# Patient Record
Sex: Male | Born: 1985 | Race: Black or African American | Hispanic: No | Marital: Single | Smoking: Current every day smoker
Health system: Southern US, Community
[De-identification: ages and names within clinical notes are randomized; demographics above are authoritative.]

---
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
## 2008-12-03 ENCOUNTER — Emergency Department (HOSPITAL_COMMUNITY): Admission: EM | Admit: 2008-12-03 | Discharge: 2008-12-03 | Payer: Self-pay | Admitting: Emergency Medicine

## 2009-08-03 ENCOUNTER — Emergency Department (HOSPITAL_COMMUNITY): Admission: EM | Admit: 2009-08-03 | Discharge: 2009-08-03 | Payer: Self-pay | Admitting: Family Medicine

## 2010-11-15 LAB — DIFFERENTIAL
Basophils Relative: 0 % (ref 0–1)
Eosinophils Relative: 0 % (ref 0–5)
Lymphocytes Relative: 24 % (ref 12–46)
Lymphs Abs: 2.1 10*3/uL (ref 0.7–4.0)
Monocytes Absolute: 0.9 10*3/uL (ref 0.1–1.0)
Monocytes Relative: 10 % (ref 3–12)
Neutro Abs: 5.6 10*3/uL (ref 1.7–7.7)

## 2010-11-15 LAB — CBC
HCT: 42.6 % (ref 39.0–52.0)
Hemoglobin: 14 g/dL (ref 13.0–17.0)
MCHC: 32.9 g/dL (ref 30.0–36.0)
Platelets: 197 10*3/uL (ref 150–400)

## 2010-11-15 LAB — POCT URINALYSIS DIP (DEVICE)
Nitrite: NEGATIVE
Protein, ur: 30 mg/dL — AB
Urobilinogen, UA: 1 mg/dL (ref 0.0–1.0)
pH: 7 (ref 5.0–8.0)

## 2012-04-17 ENCOUNTER — Encounter (HOSPITAL_COMMUNITY): Payer: Self-pay | Admitting: *Deleted

## 2012-04-17 ENCOUNTER — Emergency Department (HOSPITAL_COMMUNITY)
Admission: EM | Admit: 2012-04-17 | Discharge: 2012-04-17 | Disposition: A | Discharge status: Home / Self Care | Payer: Self-pay | Attending: Emergency Medicine | Admitting: Emergency Medicine

## 2012-04-17 DIAGNOSIS — T63461A Toxic effect of venom of wasps, accidental (unintentional), initial encounter: Secondary | ICD-10-CM | POA: Insufficient documentation

## 2012-04-17 DIAGNOSIS — T6391XA Toxic effect of contact with unspecified venomous animal, accidental (unintentional), initial encounter: Secondary | ICD-10-CM | POA: Insufficient documentation

## 2012-04-17 DIAGNOSIS — T63441A Toxic effect of venom of bees, accidental (unintentional), initial encounter: Secondary | ICD-10-CM

## 2012-04-17 DIAGNOSIS — F17200 Nicotine dependence, unspecified, uncomplicated: Secondary | ICD-10-CM | POA: Insufficient documentation

## 2012-04-17 MED ORDER — DIPHENHYDRAMINE HCL 25 MG PO TABS: 25.0000 mg | ORAL_TABLET | Freq: Four times a day (QID) | ORAL | Status: DC

## 2012-04-17 NOTE — ED Provider Notes (Signed)
History     CSN: [id number]  Arrival date & time 04/17/12  0801   First MD Initiated Contact with Patient 04/17/12 [phone]      Chief Complaint  Patient presents with  . Oral Swelling    right lip    (Consider location/radiation/quality/duration/timing/severity/associated sxs/prior treatment) HPI Comments: Patient presents to the ED after being stung by a bee on the lip. He reports immediate swelling localized to his lower lip. He reports associated prickling pain of his lower lip. He was given Unisom in the ambulance which helped. He denies any difficulty breathing or swallowing.    History reviewed. No pertinent past medical history.  History reviewed. No pertinent past surgical history.  History reviewed. No pertinent family history.  History  Substance Use Topics  . Smoking status: Current Everyday Smoker -- 1.0 packs/day    Types: Cigarettes  . Smokeless tobacco: Never Used  . Alcohol Use: Yes     occ      Review of Systems  Constitutional: Negative for fever, diaphoresis and fatigue.  HENT: Positive for facial swelling. Negative for sore throat and trouble swallowing.   Eyes: Negative for visual disturbance.  Respiratory: Negative for choking and shortness of breath.   Cardiovascular: Negative for chest pain.  Gastrointestinal: Negative for nausea, vomiting and diarrhea.  Skin: Negative for rash and wound.  Neurological: Negative for headaches.    Allergies  Review of patient's allergies indicates no known allergies.  Home Medications  No current outpatient prescriptions on file.  BP 116/82  Pulse 60  Temp 97.7 F (36.5 C) (Oral)  Resp 16  Wt 145 lb (65.772 kg)  SpO2 100%  Physical Exam  Nursing note and vitals reviewed. Constitutional: He is oriented to person, place, and time. He appears well-developed and well-nourished. No distress.  HENT:  Head: Normocephalic and atraumatic.  Mouth/Throat: Oropharynx is clear and moist. No oropharyngeal  exudate.       Notable edema of lower lip. No evidence of stinger present. Airway patent. No oropharyngeal swelling noted.   Eyes: Conjunctivae are normal. No scleral icterus.  Neck: Normal range of motion.  Cardiovascular: Normal rate and regular rhythm.  Exam reveals no gallop and no friction rub.   No murmur heard. Pulmonary/Chest: Effort normal and breath sounds normal. No respiratory distress. He has no wheezes. He has no rales. He exhibits no tenderness.  Musculoskeletal: Normal range of motion.  Neurological: He is alert and oriented to person, place, and time.  Skin: Skin is warm and dry. He is not diaphoretic.  Psychiatric: He has a normal mood and affect. His behavior is normal.    ED Course  Procedures (including critical care time)  Labs Reviewed - No data to display No results found.   No diagnosis found.    MDM  9:01 AM Patient presents after a bee sting on his lip. He denies any trouble breathing or swallowing and does not show any signs of respiratory compromise. Swelling is localized to his lip. He can be discharged with an anti-histamine for swelling. No further evaluation needed at this time.         Emilia Beck, [place] 04/17/12 [phone]

## 2012-04-17 NOTE — ED Provider Notes (Signed)
Medical screening examination/treatment/procedure(s) were performed by non-physician practitioner and as supervising physician I was immediately available for consultation/collaboration.  Dinia Joynt R. Hartley Urton, MD 04/17/12 1627 
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
## 2013-03-12 ENCOUNTER — Encounter (HOSPITAL_COMMUNITY): Payer: Self-pay | Admitting: Emergency Medicine

## 2013-03-12 ENCOUNTER — Emergency Department (HOSPITAL_COMMUNITY)
Admission: EM | Admit: 2013-03-12 | Discharge: 2013-03-12 | Disposition: A | Discharge status: Home / Self Care | Payer: Self-pay | Attending: Emergency Medicine | Admitting: Emergency Medicine

## 2013-03-12 DIAGNOSIS — E162 Hypoglycemia, unspecified: Secondary | ICD-10-CM

## 2013-03-12 DIAGNOSIS — F1010 Alcohol abuse, uncomplicated: Secondary | ICD-10-CM | POA: Insufficient documentation

## 2013-03-12 DIAGNOSIS — F17200 Nicotine dependence, unspecified, uncomplicated: Secondary | ICD-10-CM | POA: Insufficient documentation

## 2013-03-12 DIAGNOSIS — Z789 Other specified health status: Secondary | ICD-10-CM

## 2013-03-12 LAB — CBC WITH DIFFERENTIAL/PLATELET
Eosinophils Absolute: 0 10*3/uL (ref 0.0–0.7)
HCT: 42.5 % (ref 39.0–52.0)
Lymphocytes Relative: 17 % (ref 12–46)
Lymphs Abs: 1.3 10*3/uL (ref 0.7–4.0)
MCHC: 32.7 g/dL (ref 30.0–36.0)
Monocytes Absolute: 0.6 10*3/uL (ref 0.1–1.0)
Neutrophils Relative %: 75 % (ref 43–77)
Platelets: 212 10*3/uL (ref 150–400)
RBC: 5.23 MIL/uL (ref 4.22–5.81)
RDW: 14.3 % (ref 11.5–15.5)

## 2013-03-12 LAB — BASIC METABOLIC PANEL
Calcium: 9.6 mg/dL (ref 8.4–10.5)
Chloride: 101 mEq/L (ref 96–112)
GFR calc non Af Amer: >90 mL/min (ref >90)
Glucose, Bld: 153 mg/dL — ABNORMAL HIGH (ref 70–99)
Potassium: 3.8 mEq/L (ref 3.5–5.1)
Sodium: 139 mEq/L (ref 135–145)

## 2013-03-12 LAB — POCT I-STAT, CHEM 8
BUN: 15 mg/dL (ref 6–23)
Glucose, Bld: 143 mg/dL — ABNORMAL HIGH (ref 70–99)
Hemoglobin: 16.3 g/dL (ref 13.0–17.0)
Potassium: 3.8 mEq/L (ref 3.5–5.1)
TCO2: 19 mmol/L (ref 0–100)

## 2013-03-12 LAB — URINALYSIS, ROUTINE W REFLEX MICROSCOPIC
Bilirubin Urine: NEGATIVE
Ketones, ur: 15 mg/dL — AB
Leukocytes, UA: NEGATIVE
Urobilinogen, UA: 1 mg/dL (ref 0.0–1.0)
pH: 5.5 (ref 5.0–8.0)

## 2013-03-12 MED ORDER — FUROSEMIDE 10 MG/ML IJ SOLN: 80.0000 mg | Freq: Once | INTRAMUSCULAR | Status: DC

## 2013-03-12 MED ORDER — METOLAZONE 2.5 MG PO TABS: 2.5000 mg | ORAL_TABLET | Freq: Once | ORAL | Status: DC

## 2013-03-12 MED ORDER — SODIUM CHLORIDE 0.9 % IV BOLUS (SEPSIS)
1000.0000 mL | Freq: Once | INTRAVENOUS | Status: AC
  Administered 2013-03-12: 1000 mL via INTRAVENOUS
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
## 2013-03-12 NOTE — ED Notes (Signed)
Patient can not get an urine at this time will try later 

## 2013-03-12 NOTE — ED Notes (Signed)
Checked patient cbg it was 85 notified RN megan of blood sugar
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
## 2013-03-12 NOTE — ED Notes (Signed)
Per doctors order, pt given bolus of NS, sandwich, and crackers.
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
## 2013-12-23 ENCOUNTER — Emergency Department (HOSPITAL_COMMUNITY): Payer: Self-pay

## 2013-12-23 ENCOUNTER — Encounter (HOSPITAL_COMMUNITY): Payer: Self-pay | Admitting: Emergency Medicine

## 2013-12-23 DIAGNOSIS — S0280XA Fracture of other specified skull and facial bones, unspecified side, initial encounter for closed fracture: Secondary | ICD-10-CM | POA: Insufficient documentation

## 2013-12-23 DIAGNOSIS — F17200 Nicotine dependence, unspecified, uncomplicated: Secondary | ICD-10-CM | POA: Insufficient documentation

## 2013-12-23 DIAGNOSIS — Z792 Long term (current) use of antibiotics: Secondary | ICD-10-CM | POA: Insufficient documentation
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
## 2013-12-24 ENCOUNTER — Emergency Department (HOSPITAL_COMMUNITY)
Admission: EM | Admit: 2013-12-24 | Discharge: 2013-12-24 | Disposition: A | Discharge status: Home / Self Care | Payer: Self-pay | Attending: Emergency Medicine | Admitting: Emergency Medicine

## 2013-12-24 DIAGNOSIS — S0285XA Fracture of orbit, unspecified, initial encounter for closed fracture: Secondary | ICD-10-CM

## 2013-12-24 MED ORDER — OXYCODONE-ACETAMINOPHEN 5-325 MG PO TABS
1.0000 | ORAL_TABLET | Freq: Once | ORAL | Status: AC
  Administered 2013-12-24: 1 via ORAL
  Filled 2013-12-24: qty 1

## 2013-12-24 MED ORDER — HYDROCODONE-ACETAMINOPHEN 5-325 MG PO TABS: 1.0000 | ORAL_TABLET | Freq: Four times a day (QID) | ORAL | Status: AC | PRN

## 2013-12-24 MED ORDER — AMOXICILLIN 500 MG PO CAPS: 500.0000 mg | ORAL_CAPSULE | Freq: Three times a day (TID) | ORAL | Status: AC

## 2013-12-24 MED ORDER — IBUPROFEN 200 MG PO TABS
400.0000 mg | ORAL_TABLET | Freq: Once | ORAL | Status: AC
  Administered 2013-12-24: 400 mg via ORAL
  Filled 2013-12-24: qty 2

## 2013-12-24 NOTE — Discharge Instructions (Signed)
Facial Fracture A facial fracture is a break in one of the bones of your face. HOME CARE INSTRUCTIONS   Protect the injured part of your face until it is healed.  Do not participate in activities which give chance for re-injury until your doctor approves.  Gently wash and dry your face.  Wear head and facial protection while riding a bicycle, motorcycle, or snowmobile. SEEK MEDICAL CARE IF:   An oral temperature above 102 F (38.9 C) develops.  You have severe headaches or notice changes in your vision.  You have new numbness or tingling in your face.  You develop nausea (feeling sick to your stomach), vomiting or a stiff neck. SEEK IMMEDIATE MEDICAL CARE IF:   You develop difficulty seeing or experience double vision.  You become dizzy, lightheaded, or faint.  You develop trouble speaking, breathing, or swallowing.  You have a watery discharge from your nose or ear. MAKE SURE YOU:   Understand these instructions.  Will watch your condition.  Will get help right away if you are not doing well or get worse. Document Released: 08/01/2005 Document Revised: 10/24/2011 Document Reviewed: 03/20/2008 [facility] Patient Information 2014 [place], [place].  CT scan shows fractures to the floor of your orbit around your I. and to the lateral part of your I. Following up with ear nose and throat very important call for appointment. 2 weeks to decide whether this is to be fixed or not. Take antibiotic as directed take pain medicine as needed. Expect to get a very black eye.

## 2013-12-24 NOTE — ED Notes (Addendum)
Feeling worse, R face is numb, alert, NAD, calm, interactive, resps e/u, speaking in clear complete sentences, R face swelling noted. Denies hearing or visual changes, dizziness, nv or HA. Pinpoints pain to R face.

## 2013-12-24 NOTE — ED Notes (Signed)
Pt up to desk, wait explained, reviewed pt/imaging results with EDP.
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
## 2013-12-26 ENCOUNTER — Ambulatory Visit (INDEPENDENT_AMBULATORY_CARE_PROVIDER_SITE_OTHER): Payer: Self-pay | Admitting: Otolaryngology

## 2013-12-26 DIAGNOSIS — S0230XA Fracture of orbital floor, unspecified side, initial encounter for closed fracture: Secondary | ICD-10-CM

## 2013-12-26 DIAGNOSIS — S02400A Malar fracture unspecified, initial encounter for closed fracture: Secondary | ICD-10-CM

## 2013-12-26 DIAGNOSIS — S02401A Maxillary fracture, unspecified, initial encounter for closed fracture: Secondary | ICD-10-CM
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
## 2017-01-05 ENCOUNTER — Encounter (HOSPITAL_COMMUNITY): Payer: Self-pay | Admitting: Emergency Medicine

## 2017-01-05 ENCOUNTER — Emergency Department (HOSPITAL_COMMUNITY)
Admission: EM | Admit: 2017-01-05 | Discharge: 2017-01-05 | Disposition: A | Discharge status: Home / Self Care | Payer: No Typology Code available for payment source | Attending: Emergency Medicine | Admitting: Emergency Medicine

## 2017-01-05 DIAGNOSIS — Y999 Unspecified external cause status: Secondary | ICD-10-CM | POA: Insufficient documentation

## 2017-01-05 DIAGNOSIS — F17210 Nicotine dependence, cigarettes, uncomplicated: Secondary | ICD-10-CM | POA: Insufficient documentation

## 2017-01-05 DIAGNOSIS — S0501XA Injury of conjunctiva and corneal abrasion without foreign body, right eye, initial encounter: Secondary | ICD-10-CM | POA: Insufficient documentation

## 2017-01-05 DIAGNOSIS — S0591XA Unspecified injury of right eye and orbit, initial encounter: Secondary | ICD-10-CM | POA: Diagnosis present

## 2017-01-05 DIAGNOSIS — Y92488 Other paved roadways as the place of occurrence of the external cause: Secondary | ICD-10-CM | POA: Diagnosis not present

## 2017-01-05 DIAGNOSIS — Y9389 Activity, other specified: Secondary | ICD-10-CM | POA: Diagnosis not present

## 2017-01-05 DIAGNOSIS — Z23 Encounter for immunization: Secondary | ICD-10-CM | POA: Insufficient documentation

## 2017-01-05 MED ORDER — IBUPROFEN 800 MG PO TABS
800.0000 mg | ORAL_TABLET | Freq: Once | ORAL | Status: AC
  Administered 2017-01-05: 800 mg via ORAL
  Filled 2017-01-05: qty 1

## 2017-01-05 MED ORDER — IBUPROFEN 800 MG PO TABS: 800.0000 mg | ORAL_TABLET | Freq: Three times a day (TID) | ORAL | 0 refills | Status: AC

## 2017-01-05 MED ORDER — POLYMYXIN B-TRIMETHOPRIM 10000-0.1 UNIT/ML-% OP SOLN
1.0000 [drp] | OPHTHALMIC | Status: DC
  Administered 2017-01-05: 1 [drp] via OPHTHALMIC
  Filled 2017-01-05: qty 10

## 2017-01-05 MED ORDER — TETRACAINE HCL 0.5 % OP SOLN
2.0000 [drp] | Freq: Once | OPHTHALMIC | Status: AC
  Administered 2017-01-05: 2 [drp] via OPHTHALMIC
  Filled 2017-01-05: qty 4

## 2017-01-05 MED ORDER — FLUORESCEIN SODIUM 0.6 MG OP STRP
1.0000 | ORAL_STRIP | Freq: Once | OPHTHALMIC | Status: AC
  Administered 2017-01-05: 1 via OPHTHALMIC
  Filled 2017-01-05: qty 1

## 2017-01-05 MED ORDER — CYCLOBENZAPRINE HCL 10 MG PO TABS: 10.0000 mg | ORAL_TABLET | Freq: Two times a day (BID) | ORAL | 0 refills | Status: AC | PRN

## 2017-01-05 MED ORDER — TETANUS-DIPHTH-ACELL PERTUSSIS 5-2.5-18.5 LF-MCG/0.5 IM SUSP
0.5000 mL | Freq: Once | INTRAMUSCULAR | Status: AC
  Administered 2017-01-05: 0.5 mL via INTRAMUSCULAR
  Filled 2017-01-05: qty 0.5
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
## 2019-12-24 ENCOUNTER — Other Ambulatory Visit: Payer: Self-pay

## 2019-12-24 ENCOUNTER — Ambulatory Visit (HOSPITAL_COMMUNITY)
Admission: EM | Admit: 2019-12-24 | Discharge: 2019-12-24 | Disposition: A | Discharge status: Home / Self Care | Payer: Self-pay | Attending: Physician Assistant | Admitting: Physician Assistant

## 2019-12-24 ENCOUNTER — Encounter (HOSPITAL_COMMUNITY): Payer: Self-pay

## 2019-12-24 DIAGNOSIS — L03213 Periorbital cellulitis: Secondary | ICD-10-CM

## 2019-12-24 DIAGNOSIS — H00014 Hordeolum externum left upper eyelid: Secondary | ICD-10-CM

## 2019-12-24 MED ORDER — SULFAMETHOXAZOLE-TRIMETHOPRIM 800-160 MG PO TABS: 1.0000 | ORAL_TABLET | Freq: Two times a day (BID) | ORAL | 0 refills | Status: AC

## 2019-12-24 MED ORDER — CEFDINIR 300 MG PO CAPS: 300.0000 mg | ORAL_CAPSULE | Freq: Two times a day (BID) | ORAL | 0 refills | Status: AC

## 2019-12-24 NOTE — ED Provider Notes (Signed)
[facility]    CSN: [id number] Arrival date & time: 12/24/19  1259      History   Chief Complaint Chief Complaint  Patient presents with  . Eye Problem    HPI Justin Wall is a 34 y.o. male.   Patient reports urgent care for evaluation of left eye swelling and pain.  Reports this started for 5 days ago.  He reports he had a small bump on the upper left eyelid this is since become more swollen and painful over the last few days.  Reports the swelling and redness is spread across his upper eyelid.  He reports he believes this is impacting his vision a little bit is not able to open the eye completely at times.  Denies blurry vision.  He had a moment of discharge notes.  He denies pain of the eye itself and reports that it is in the eyelid.  He reports trying some antibiotics that his mom gave him but he cannot recall the name.  Denies fever, chills.  Denies recent upper respiratory symptoms.  Denies anything hitting the eye.     History reviewed. No pertinent past medical history.  There are no problems to display for this patient.   History reviewed. No pertinent surgical history.     Home Medications    Prior to Admission medications   Medication Sig Start Date End Date Taking? Authorizing Provider  amoxicillin (AMOXIL) 500 MG capsule Take 1 capsule (500 mg total) by mouth 3 (three) times daily. 12/24/13   Vanetta Mulders, MD  cefdinir (OMNICEF) 300 MG capsule Take 1 capsule (300 mg total) by mouth 2 (two) times daily for 5 days. 12/24/19 12/29/19  Alyce Inscore, Veryl Speak, PA-C  cyclobenzaprine (FLEXERIL) 10 MG tablet Take 1 tablet (10 mg total) by mouth 2 (two) times daily as needed for muscle spasms. 01/05/17   Fayrene Helper, PA-C  HYDROcodone-acetaminophen (NORCO/VICODIN) 5-325 MG per tablet Take 1-2 tablets by mouth every 6 (six) hours as needed for moderate pain. 12/24/13   Vanetta Mulders, MD  ibuprofen (ADVIL,MOTRIN) 800 MG tablet Take 1 tablet (800 mg total) by  mouth 3 (three) times daily. 01/05/17   Fayrene Helper, PA-C  sulfamethoxazole-trimethoprim (BACTRIM DS) 800-160 MG tablet Take 1 tablet by mouth 2 (two) times daily for 5 days. 12/24/19 12/29/19  Yasmina Chico, Veryl Speak, PA-C    Family History No family history on file.  Social History Social History   Tobacco Use  . Smoking status: Current Every Day Smoker    Packs/day: 0.25    Years: 5.00    Pack years: 1.25    Types: Cigarettes  . Smokeless tobacco: Never Used  Substance Use Topics  . Alcohol use: Yes    Alcohol/week: 2.0 standard drinks    Types: 2 Cans of beer per week    Comment: occ  . Drug use: Yes    Frequency: 2.0 times per week    Types: Marijuana    Comment: occ     Allergies   Patient has no known allergies.   Review of Systems Review of Systems Per HPI  Physical Exam Triage Vital Signs ED Triage Vitals [12/24/19 1406]  Enc Vitals Group     BP 137/74     Pulse Rate 65     Resp 16     Temp 97.8 F (36.6 C)     Temp Source Oral     SpO2 97 %     Weight  Height      Head Circumference      Peak Flow      Pain Score 8     Pain Loc      Pain Edu?      Excl. in [place]?    No data found.  Updated Vital Signs BP 137/74 (BP Location: Left Arm)   Pulse 65   Temp 97.8 F (36.6 C) (Oral)   Resp 16   SpO2 97%   Visual Acuity Right Eye Distance:   Left Eye Distance:   Bilateral Distance:    Right Eye Near:   Left Eye Near:    Bilateral Near:     Physical Exam Vitals and nursing note reviewed.  Constitutional:      General: He is not in acute distress.    Appearance: Normal appearance. He is not ill-appearing.  HENT:     Head: Normocephalic and atraumatic.     Nose: Nose normal.  Eyes:     General: Lids are everted, no foreign bodies appreciated. Vision grossly intact. No scleral icterus.       Left eye: Hordeolum present.    Extraocular Movements: Extraocular movements intact.     Conjunctiva/sclera:     Right eye: Right conjunctiva is not  injected. No chemosis, exudate or hemorrhage.    Left eye: Left conjunctiva is not injected. No chemosis, exudate or hemorrhage.    Pupils: Pupils are equal, round, and reactive to light.      Comments: No lower eyelid involvement. Very tender to touch upper eye lid.  There is some mild discharge in the medial canthus and under the upper outer portion of the upper eyelid.  No crusting of the upper lid.    Neurological:     Mental Status: He is alert.      UC Treatments / Results  Labs (all labs ordered are listed, but only abnormal results are displayed) Labs Reviewed - No data to display  EKG   Radiology No results found.  Procedures Procedures (including critical care time)  Medications Ordered in UC Medications - No data to display  Initial Impression / Assessment and Plan / UC Course  I have reviewed the triage vital signs and the nursing notes.  Pertinent labs & imaging results that were available during my care of the patient were reviewed by me and considered in my medical decision making (see chart for details).     #Hordeuleum  #Preseptal cellulitis of the left upper eyelid Patient is a 34 year old male presenting with hordeolum of the left upper eyelid with now likely secondary cellulitis developing.  Given worsening symptoms and clinical examination showing swelling and redness of the upper eyelid, do believe do believe that likely there is a developing cellulitis from the hordeolum.  Given this will start on Bactrim and cefdinir per up-to-date guidelines for appropriate coverage of streptococcal and MRSA infections.  We will have him continue to place warm compresses.  Strict follow-up and return precautions were discussed.  Patient verbalized understanding.  Final Clinical Impressions(s) / UC Diagnoses   Final diagnoses:  Hordeolum externum of left upper eyelid  Preseptal cellulitis of left upper eyelid     Discharge Instructions     Take both of the  antiobiotics as prescribed - bactrim, twice a day for 5 days - cefdinir twice a day for 5 days  Apply warm compresses throughout the day  Return if not improved after treatment or significantly worsening      ED  Prescriptions    Medication Sig Dispense Auth. Provider   sulfamethoxazole-trimethoprim (BACTRIM DS) 800-160 MG tablet Take 1 tablet by mouth 2 (two) times daily for 5 days. 10 tablet Miamarie Moll, Veryl Speak, PA-C   cefdinir (OMNICEF) 300 MG capsule Take 1 capsule (300 mg total) by mouth 2 (two) times daily for 5 days. 10 capsule Lakendra Helling, Veryl Speak, PA-C     PDMP not reviewed this encounter.   Hermelinda Medicus, PA-C 12/24/19 2313

## 2019-12-24 NOTE — ED Triage Notes (Signed)
Patient reports eye swelling x1 wk. Reports his mom gave him some abx but they are not helping.

## 2019-12-24 NOTE — Discharge Instructions (Signed)
Take both of the antiobiotics as prescribed - bactrim, twice a day for 5 days - cefdinir twice a day for 5 days  Apply warm compresses throughout the day  Return if not improved after treatment or significantly worsening

## 2020-03-14 ENCOUNTER — Other Ambulatory Visit: Payer: Self-pay

## 2020-03-14 ENCOUNTER — Ambulatory Visit: Payer: Self-pay | Attending: Internal Medicine

## 2020-03-14 DIAGNOSIS — Z23 Encounter for immunization: Secondary | ICD-10-CM

## 2020-03-14 NOTE — Progress Notes (Signed)
   Covid-19 Vaccination Clinic  Name:  Justin Wall    MRN: [id number] DOB: 1985-11-03  03/14/2020  Mr. Sluder was observed post Covid-19 immunization for 15 minutes without incident. He was provided with Vaccine Information Sheet and instruction to access the V-Safe system.   Mr. Dome was instructed to call 911 with any severe reactions post vaccine: Marland Kitchen Difficulty breathing  . Swelling of face and throat  . A fast heartbeat  . A bad rash all over body  . Dizziness and weakness   Immunizations Administered    Name Date Dose VIS Date Route   Pfizer COVID-19 Vaccine 03/14/2020 10:46 AM 0.3 mL 10/09/2018 Intramuscular   Manufacturer: [employer], [employer]   Lot: [id number]   NDC: [address]-1

## 2020-04-07 ENCOUNTER — Ambulatory Visit: Payer: Self-pay

## 2020-12-06 ENCOUNTER — Ambulatory Visit (HOSPITAL_COMMUNITY)
Admission: EM | Admit: 2020-12-06 | Discharge: 2020-12-06 | Disposition: A | Discharge status: Home / Self Care | Payer: Self-pay | Attending: Family Medicine | Admitting: Family Medicine

## 2020-12-06 ENCOUNTER — Encounter (HOSPITAL_COMMUNITY): Payer: Self-pay

## 2020-12-06 ENCOUNTER — Other Ambulatory Visit: Payer: Self-pay

## 2020-12-06 DIAGNOSIS — H00014 Hordeolum externum left upper eyelid: Secondary | ICD-10-CM

## 2020-12-06 MED ORDER — POLYMYXIN B-TRIMETHOPRIM 10000-0.1 UNIT/ML-% OP SOLN: 1.0000 [drp] | Freq: Four times a day (QID) | OPHTHALMIC | 0 refills | Status: AC

## 2020-12-06 NOTE — ED Provider Notes (Signed)
[facility]    CSN: [id number] Arrival date & time: 12/06/20  1038      History   Chief Complaint Chief Complaint  Patient presents with  . Facial Swelling    HPI Justin Wall is a 35 y.o. male.   Patient here today with left eye lid swelling redness and tenderness that started yesterday.  Denies drainage, globe of eye injury or pain, fever, chills, headache, visual changes.  So far not trying anything over-the-counter for symptoms.    History reviewed. No pertinent past medical history.  There are no problems to display for this patient.   History reviewed. No pertinent surgical history.   Home Medications    Prior to Admission medications   Medication Sig Start Date End Date Taking? Authorizing Provider  trimethoprim-polymyxin b (POLYTRIM) ophthalmic solution Place 1 drop into the left eye every 6 (six) hours. 12/06/20  Yes Particia Nearing, PA-C  amoxicillin (AMOXIL) 500 MG capsule Take 1 capsule (500 mg total) by mouth 3 (three) times daily. 12/24/13   Vanetta Mulders, MD  cyclobenzaprine (FLEXERIL) 10 MG tablet Take 1 tablet (10 mg total) by mouth 2 (two) times daily as needed for muscle spasms. 01/05/17   Fayrene Helper, PA-C  HYDROcodone-acetaminophen (NORCO/VICODIN) 5-325 MG per tablet Take 1-2 tablets by mouth every 6 (six) hours as needed for moderate pain. 12/24/13   Vanetta Mulders, MD  ibuprofen (ADVIL,MOTRIN) 800 MG tablet Take 1 tablet (800 mg total) by mouth 3 (three) times daily. 01/05/17   Fayrene Helper, PA-C    Family History History reviewed. No pertinent family history.  Social History Social History   Tobacco Use  . Smoking status: Current Every Day Smoker    Packs/day: 0.25    Years: 5.00    Pack years: 1.25    Types: Cigarettes  . Smokeless tobacco: Never Used  Vaping Use  . Vaping Use: Never used  Substance Use Topics  . Alcohol use: Yes    Alcohol/week: 2.0 standard drinks    Types: 2 Cans of beer per week     Comment: occ  . Drug use: Yes    Frequency: 2.0 times per week    Types: Marijuana    Comment: occ     Allergies   Patient has no known allergies.   Review of Systems Review of Systems Per HPI  Physical Exam Triage Vital Signs ED Triage Vitals [12/06/20 1111]  Enc Vitals Group     BP 127/80     Pulse Rate 62     Resp 17     Temp 98.3 F (36.8 C)     Temp src      SpO2 98 %     Weight      Height      Head Circumference      Peak Flow      Pain Score 7     Pain Loc      Pain Edu?      Excl. in [place]?    No data found.  Updated Vital Signs BP 127/80   Pulse 62   Temp 98.3 F (36.8 C)   Resp 17   SpO2 98%   Visual Acuity Right Eye Distance:   Left Eye Distance:   Bilateral Distance:    Right Eye Near:   Left Eye Near:    Bilateral Near:     Physical Exam Vitals and nursing note reviewed.  Constitutional:      Appearance: Normal appearance.  HENT:     Head: Atraumatic.  Eyes:     General:        Right eye: No discharge.        Left eye: No discharge.     Extraocular Movements: Extraocular movements intact.     Conjunctiva/sclera: Conjunctivae normal.     Comments: Stye present left upper eyelid surrounding erythema and edema.  No active drainage.  Conjunctive a benign  Cardiovascular:     Rate and Rhythm: Normal rate and regular rhythm.  Pulmonary:     Effort: Pulmonary effort is normal.     Breath sounds: Normal breath sounds.  Musculoskeletal:        General: Normal range of motion.     Cervical back: Normal range of motion and neck supple.  Skin:    General: Skin is warm and dry.  Neurological:     General: No focal deficit present.     Mental Status: He is oriented to person, place, and time.  Psychiatric:        Mood and Affect: Mood normal.        Thought Content: Thought content normal.        Judgment: Judgment normal.     UC Treatments / Results  Labs (all labs ordered are listed, but only abnormal results are  displayed) Labs Reviewed - No data to display  EKG   Radiology No results found.  Procedures Procedures (including critical care time)  Medications Ordered in UC Medications - No data to display  Initial Impression / Assessment and Plan / UC Course  I have reviewed the triage vital signs and the nursing notes.  Pertinent labs & imaging results that were available during my care of the patient were reviewed by me and considered in my medical decision making (see chart for details).     Will treat with Polytrim drops, warm compresses.  Over-the-counter pain relievers as needed.  Follow-up with PCP if not resolving.  Final Clinical Impressions(s) / UC Diagnoses   Final diagnoses:  Hordeolum externum of left upper eyelid   Discharge Instructions   None    ED Prescriptions    Medication Sig Dispense Auth. Provider   trimethoprim-polymyxin b (POLYTRIM) ophthalmic solution Place 1 drop into the left eye every 6 (six) hours. 10 mL Particia Nearing, [place]     PDMP not reviewed this encounter.   Particia Nearing, [place] 12/06/20 1559

## 2020-12-06 NOTE — ED Triage Notes (Signed)
Pt in with c/o left eye swelling that started yesterday  Pt states it has happened in the past due to pollen  Pt did not take any medication for sxs  Denies any vision changes
